# Patient Record
Sex: Male | Born: 1940 | Race: Black or African American | Hispanic: No | Marital: Married | Smoking: Current every day smoker
Health system: Southern US, Community
[De-identification: ages and names within clinical notes are randomized; demographics above are authoritative.]

## PROBLEM LIST (undated history)

## (undated) DIAGNOSIS — J449 Chronic obstructive pulmonary disease, unspecified: Secondary | ICD-10-CM

## (undated) DIAGNOSIS — K259 Gastric ulcer, unspecified as acute or chronic, without hemorrhage or perforation: Secondary | ICD-10-CM

## (undated) HISTORY — PX: OTHER SURGICAL HISTORY: SHX169

## (undated) HISTORY — PX: CHOLECYSTECTOMY: SHX55

---

## 2014-01-04 ENCOUNTER — Emergency Department (HOSPITAL_BASED_OUTPATIENT_CLINIC_OR_DEPARTMENT_OTHER): Payer: Medicare Other

## 2014-01-04 ENCOUNTER — Encounter (HOSPITAL_BASED_OUTPATIENT_CLINIC_OR_DEPARTMENT_OTHER): Payer: Self-pay | Admitting: Emergency Medicine

## 2014-01-04 ENCOUNTER — Emergency Department (HOSPITAL_BASED_OUTPATIENT_CLINIC_OR_DEPARTMENT_OTHER)
Admission: EM | Admit: 2014-01-04 | Discharge: 2014-01-04 | Disposition: A | Discharge status: Other Acute Inpatient Hospital | Payer: Medicare Other | Attending: Emergency Medicine | Admitting: Emergency Medicine

## 2014-01-04 DIAGNOSIS — J159 Unspecified bacterial pneumonia: Secondary | ICD-10-CM | POA: Insufficient documentation

## 2014-01-04 DIAGNOSIS — J438 Other emphysema: Secondary | ICD-10-CM | POA: Insufficient documentation

## 2014-01-04 DIAGNOSIS — J189 Pneumonia, unspecified organism: Secondary | ICD-10-CM

## 2014-01-04 DIAGNOSIS — Z8719 Personal history of other diseases of the digestive system: Secondary | ICD-10-CM | POA: Diagnosis not present

## 2014-01-04 DIAGNOSIS — F17200 Nicotine dependence, unspecified, uncomplicated: Secondary | ICD-10-CM | POA: Diagnosis not present

## 2014-01-04 DIAGNOSIS — R112 Nausea with vomiting, unspecified: Secondary | ICD-10-CM | POA: Diagnosis not present

## 2014-01-04 DIAGNOSIS — R109 Unspecified abdominal pain: Secondary | ICD-10-CM | POA: Insufficient documentation

## 2014-01-04 DIAGNOSIS — R1310 Dysphagia, unspecified: Secondary | ICD-10-CM | POA: Diagnosis present

## 2014-01-04 HISTORY — DX: Gastric ulcer, unspecified as acute or chronic, without hemorrhage or perforation: K25.9

## 2014-01-04 HISTORY — DX: Chronic obstructive pulmonary disease, unspecified: J44.9

## 2014-01-04 LAB — CBC WITH DIFFERENTIAL/PLATELET
Basophils Absolute: 0 10*3/uL (ref 0.0–0.1)
Basophils Relative: 0 % (ref 0–1)
Eosinophils Absolute: 0 10*3/uL (ref 0.0–0.7)
Eosinophils Relative: 0 % (ref 0–5)
HEMATOCRIT: 40 % (ref 39.0–52.0)
Hemoglobin: 14.5 g/dL (ref 13.0–17.0)
LYMPHS ABS: 0.9 10*3/uL (ref 0.7–4.0)
LYMPHS PCT: 5 % — AB (ref 12–46)
MCH: 33.9 pg (ref 26.0–34.0)
MCHC: 36.3 g/dL — AB (ref 30.0–36.0)
MCV: 93.5 fL (ref 78.0–100.0)
MONO ABS: 1.2 10*3/uL — AB (ref 0.1–1.0)
MONOS PCT: 6 % (ref 3–12)
NEUTROS ABS: 15.8 10*3/uL — AB (ref 1.7–7.7)
Neutrophils Relative %: 88 % — ABNORMAL HIGH (ref 43–77)
Platelets: 236 10*3/uL (ref 150–400)
RBC: 4.28 MIL/uL (ref 4.22–5.81)
RDW: 12.6 % (ref 11.5–15.5)
WBC: 17.9 10*3/uL — AB (ref 4.0–10.5)

## 2014-01-04 LAB — COMPREHENSIVE METABOLIC PANEL
ALK PHOS: 71 U/L (ref 39–117)
ALT: 8 U/L (ref 0–53)
AST: 14 U/L (ref 0–37)
Albumin: 3.2 g/dL — ABNORMAL LOW (ref 3.5–5.2)
Anion gap: 18 — ABNORMAL HIGH (ref 5–15)
BILIRUBIN TOTAL: 1.1 mg/dL (ref 0.3–1.2)
BUN: 14 mg/dL (ref 6–23)
CHLORIDE: 100 meq/L (ref 96–112)
CO2: 22 meq/L (ref 19–32)
Calcium: 9.7 mg/dL (ref 8.4–10.5)
Creatinine, Ser: 0.6 mg/dL (ref 0.50–1.35)
GFR calc Af Amer: >90 mL/min (ref >90)
GLUCOSE: 129 mg/dL — AB (ref 70–99)
POTASSIUM: 3.9 meq/L (ref 3.7–5.3)
Sodium: 140 mEq/L (ref 137–147)
Total Protein: 7.2 g/dL (ref 6.0–8.3)

## 2014-01-04 LAB — LIPASE, BLOOD: LIPASE: 14 U/L (ref 11–59)

## 2014-01-04 MED ORDER — DEXTROSE 5 % IV SOLN: 1.0000 g | INTRAVENOUS | Status: DC

## 2014-01-04 MED ORDER — DEXTROSE 5 % IV SOLN
500.0000 mg | INTRAVENOUS | Status: DC
  Administered 2014-01-04: 500 mg via INTRAVENOUS
  Filled 2014-01-04: qty 500

## 2014-01-04 MED ORDER — SODIUM CHLORIDE 0.9 % IV BOLUS (SEPSIS): 1000.0000 mL | Freq: Once | INTRAVENOUS | Status: DC

## 2014-01-04 MED ORDER — ONDANSETRON HCL 4 MG/2ML IJ SOLN
4.0000 mg | Freq: Once | INTRAMUSCULAR | Status: AC
  Administered 2014-01-04: 4 mg via INTRAVENOUS
  Filled 2014-01-04: qty 2

## 2014-01-04 MED ORDER — SODIUM CHLORIDE 0.9 % IV BOLUS (SEPSIS)
500.0000 mL | Freq: Once | INTRAVENOUS | Status: AC
  Administered 2014-01-04: 500 mL via INTRAVENOUS

## 2014-01-04 MED ORDER — IOHEXOL 300 MG/ML  SOLN
100.0000 mL | Freq: Once | INTRAMUSCULAR | Status: AC | PRN
  Administered 2014-01-04: 100 mL via INTRAVENOUS

## 2014-01-04 MED ORDER — SODIUM CHLORIDE 0.9 % IV SOLN
INTRAVENOUS | Status: DC
Start: 2014-01-04 — End: 2014-01-04
  Administered 2014-01-04: 10:00:00 via INTRAVENOUS

## 2014-01-04 NOTE — ED Notes (Addendum)
Difficulty swallowing x 3 days and unable to keep po fluids down. Vomiting several times.  Also reports chronic back pain and intermittent abdominal pain.  Went to [facility] last night, had labs collected but left prior to being evaluated by a physician.

## 2014-01-04 NOTE — ED Provider Notes (Signed)
CSN: [id number]     Arrival date & time 01/04/14  [id number] History   First MD Initiated Contact with Patient 01/04/14 [phone]     Chief Complaint  Patient presents with  . Dysphagia     (Consider location/radiation/quality/duration/timing/severity/associated sxs/prior Treatment) The history is provided by the patient.   Patient here complaining of two-day history of nonbilious vomiting with associated nausea without fever. He has no weight loss over the past month. He has had some cough and shortness of breath. Denies any urinary symptoms. No black or bloody stools. No chest pain or chest pressure. He does have a history of COPD but denies any increased symptoms with that. Went to another hospital yesterday but did not get seen. Symptoms are persistent and nothing makes them better or worse. No treatment used prior to arrival Past Medical History  Diagnosis Date  . COPD (chronic obstructive pulmonary disease)   . Multiple gastric ulcers    Past Surgical History  Procedure Laterality Date  . Cholecystectomy    . Gastic ulcer surgery     No family history on file. History  Substance Use Topics  . Smoking status: Current Every Day Smoker -- 1.00 packs/day    Types: Cigarettes  . Smokeless tobacco: Not on file  . Alcohol Use: No    Review of Systems  All other systems reviewed and are negative.     Allergies  Review of patient's allergies indicates no known allergies.  Home Medications   Prior to Admission medications   Medication Sig Start Date End Date Taking? Authorizing Provider  acetaminophen (TYLENOL) 325 MG tablet Take 650 mg by mouth every 6 (six) hours as needed.   Yes Historical Provider, MD  Aspirin-Caffeine 845-65 MG PACK Take by mouth.   Yes Historical Provider, MD   BP 126/75  Pulse 97  Temp(Src) 98.1 F (36.7 C) (Oral)  Resp 18  Ht 5\' 6"  (1.676 m)  Wt 130 lb (58.968 kg)  BMI 20.99 kg/m2  SpO2 94% Physical Exam  Nursing note and vitals  reviewed. Constitutional: He is oriented to person, place, and time. He appears cachectic.  Non-toxic appearance. No distress.  HENT:  Head: Normocephalic and atraumatic.  Eyes: Conjunctivae, EOM and lids are normal. Pupils are equal, round, and reactive to light.  Neck: Normal range of motion. Neck supple. No tracheal deviation present. No mass present.  Cardiovascular: Normal rate, regular rhythm and normal heart sounds.  Exam reveals no gallop.   No murmur heard. Pulmonary/Chest: Effort normal and breath sounds normal. No stridor. No respiratory distress. He has no decreased breath sounds. He has no wheezes. He has no rhonchi. He has no rales.  Abdominal: Soft. Normal appearance and bowel sounds are normal. He exhibits no distension. There is no tenderness. There is no rebound and no CVA tenderness.  Musculoskeletal: Normal range of motion. He exhibits no edema and no tenderness.  Neurological: He is alert and oriented to person, place, and time. He has normal strength. No cranial nerve deficit or sensory deficit. GCS eye subscore is 4. GCS verbal subscore is 5. GCS motor subscore is 6.  Skin: Skin is warm and dry. No abrasion and no rash noted.  Psychiatric: He has a normal mood and affect. His speech is normal and behavior is normal.    ED Course  Procedures (including critical care time) Labs Review Labs Reviewed  URINALYSIS, ROUTINE W REFLEX MICROSCOPIC  CBC WITH DIFFERENTIAL  COMPREHENSIVE METABOLIC PANEL  LIPASE, BLOOD    Imaging  Review No results found.   EKG Interpretation None      MDM   Final diagnoses:  None    Patient's CT scan consistent with pneumonia.Patient started on antibiotics and will be transferred to [facility] per his request   Toy Baker, MD 01/04/14 1138

## 2014-06-25 DEATH — deceased

## 2015-08-06 IMAGING — CT CT CHEST W/ CM
3 of 5 series · 6 of 46 positions shown, 11 images · IV contrast (APPLIED)
Comparison: 10/13/2012 CT

CLINICAL DATA: 73-year-old male with dysphagia, vomiting, abdominal
and pelvic pain.

EXAM:
CT CHEST, ABDOMEN, AND PELVIS WITH CONTRAST
TECHNIQUE: Multidetector CT imaging of the chest, abdomen and pelvis was
performed following the standard protocol during bolus
administration of intravenous contrast.
CONTRAST:  100mL OMNIPAQUE IOHEXOL 300 MG/ML  SOLN

[Series 5: chest/abd/pel 3.0 coronal · coronal · 0.62mm/px · 3 of 73 slices shown, 4 images]
[im 25/73  soft-tissue]
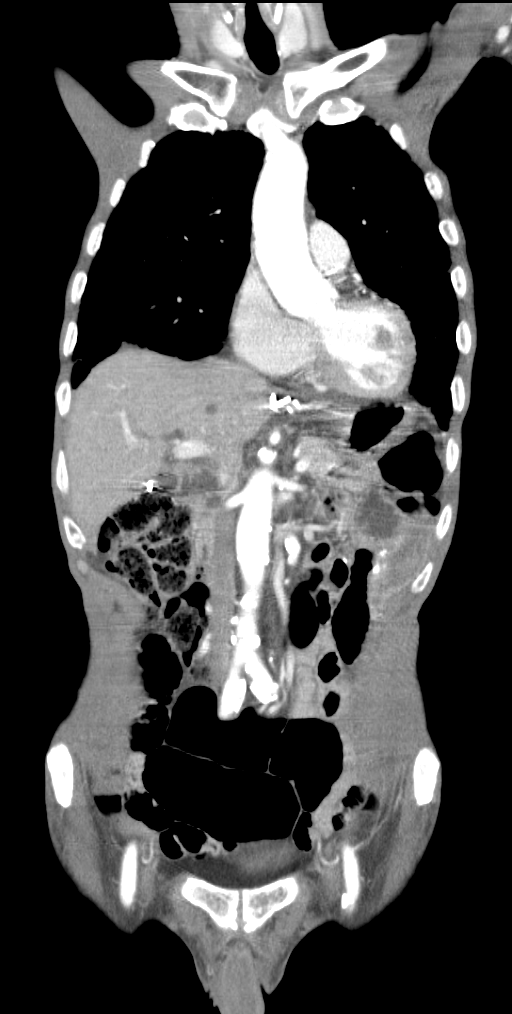
[im 33/73  soft-tissue]
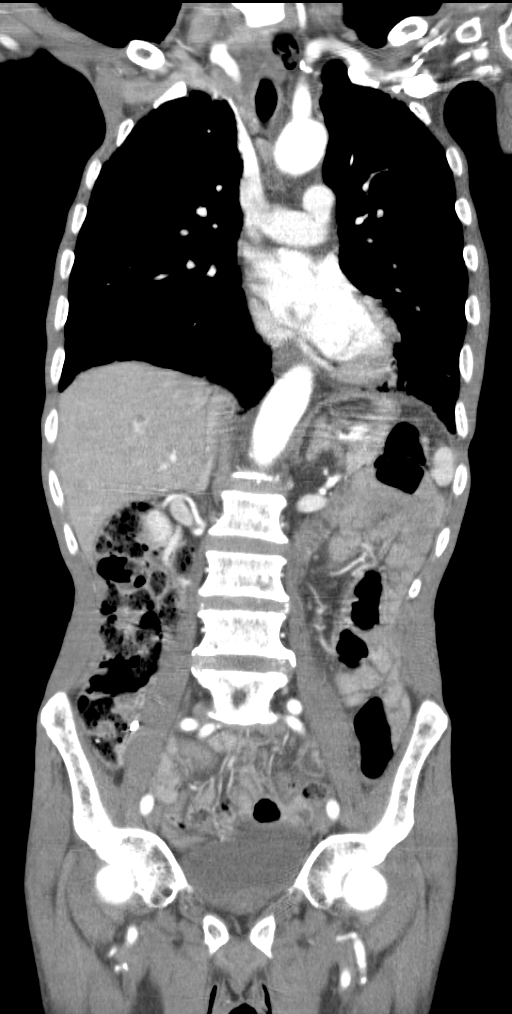
[im 33/73  bone]
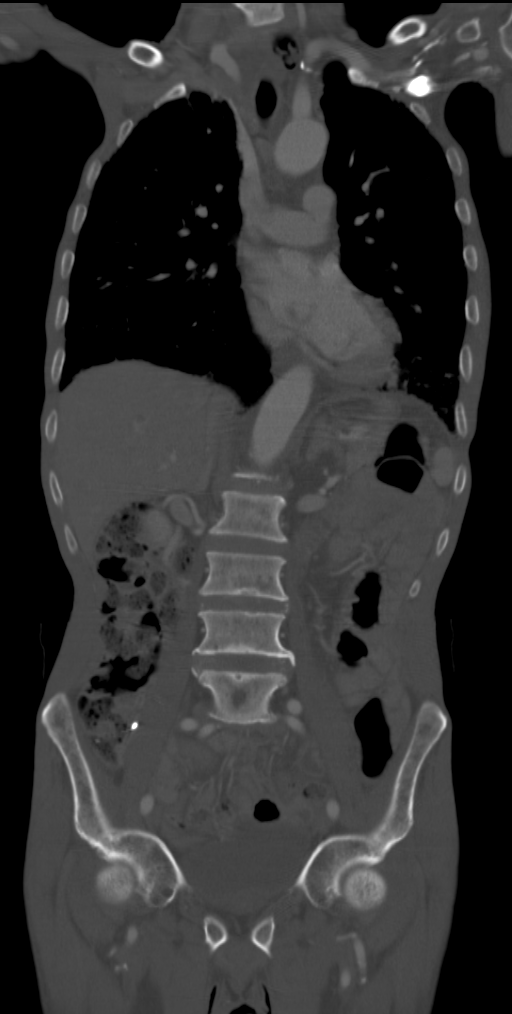
[im 41/73  soft-tissue]
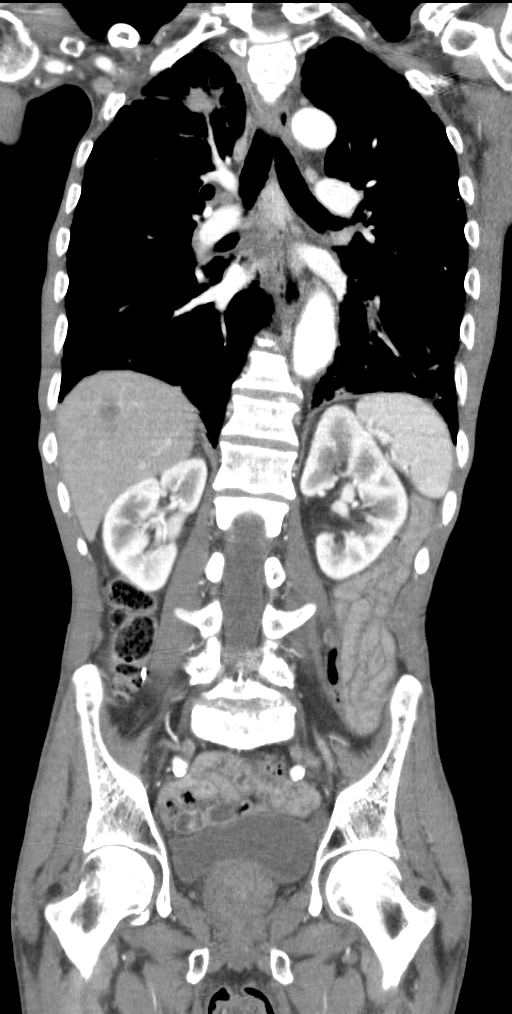

[Series 6: chest/abd/pel 3.0 sagittal · sagittal · 0.50mm/px · 1 of 106 slices shown, 2 images]
[im 36/106  soft-tissue]
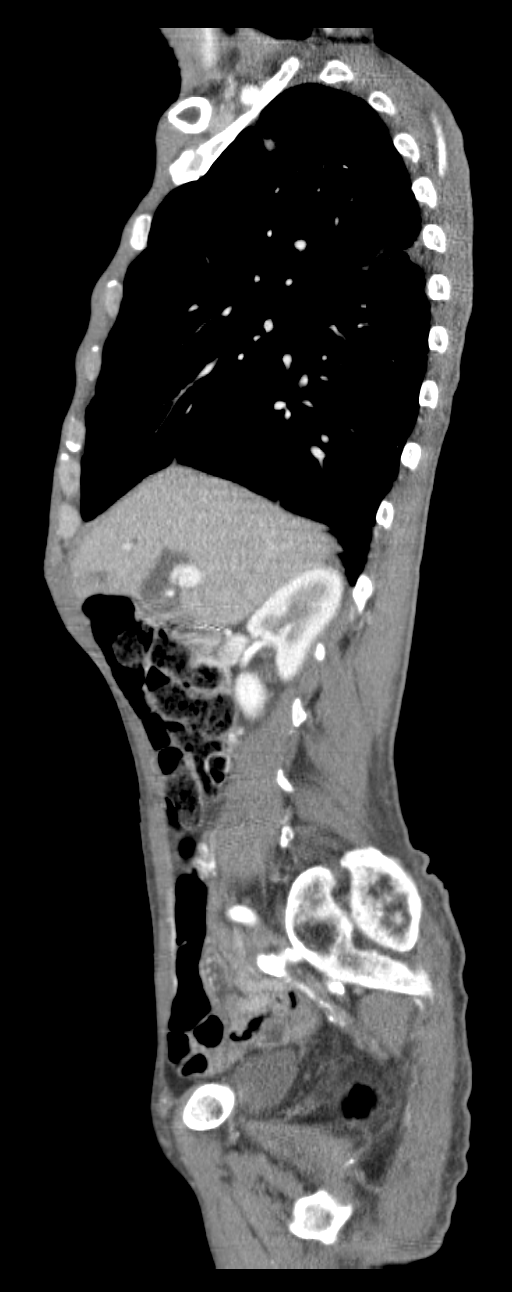
[im 36/106  bone]
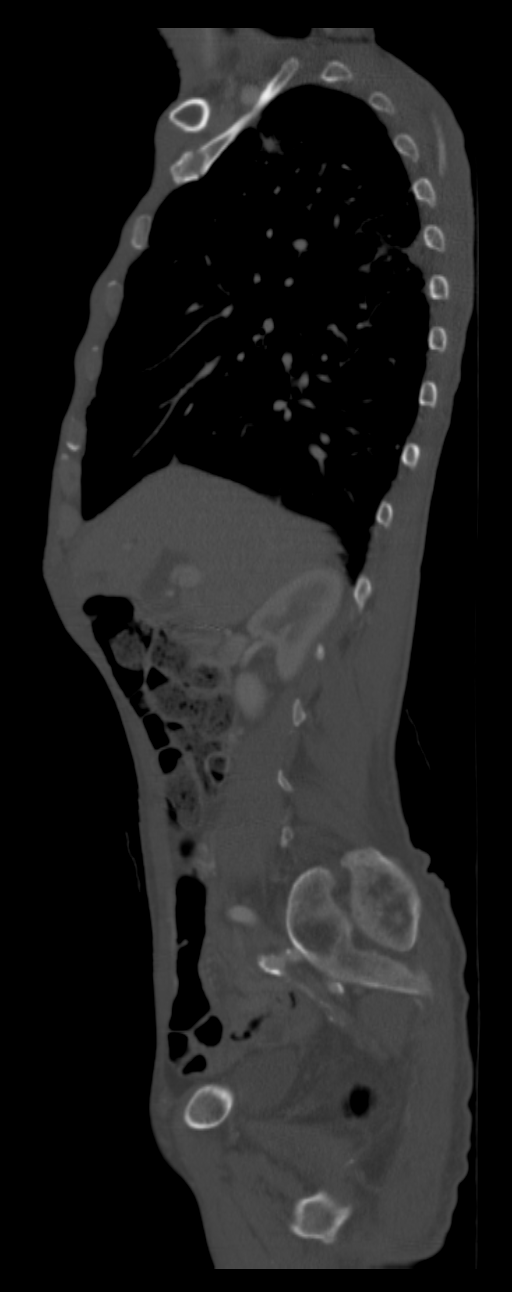

[Series 7: renal delay 5.0 b30f · axial · delayed · 0.57mm/px · z∈[-345,-300]mm · 2 of 29 slices shown, 5 images]
[im 10/29  soft-tissue]
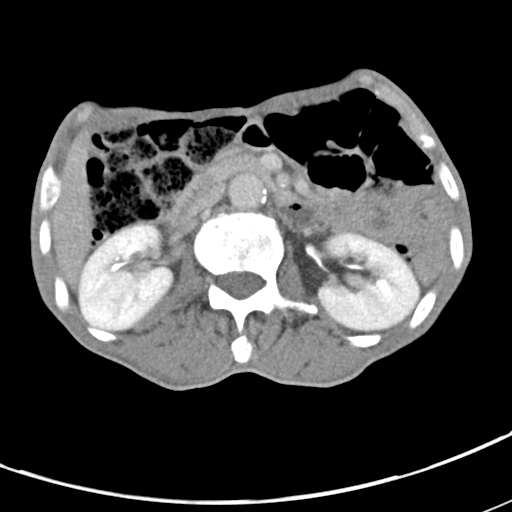
[im 10/29  lung]
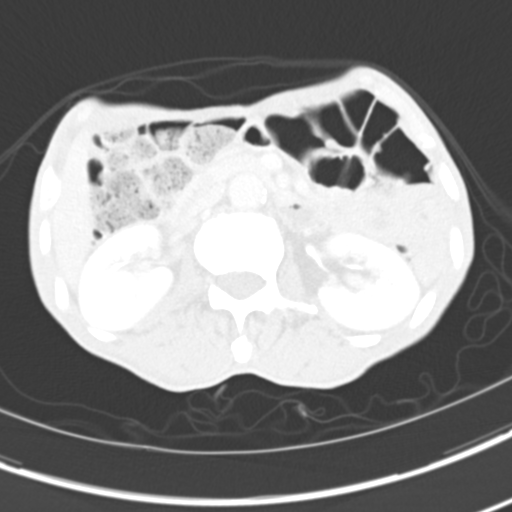
[im 10/29  bone]
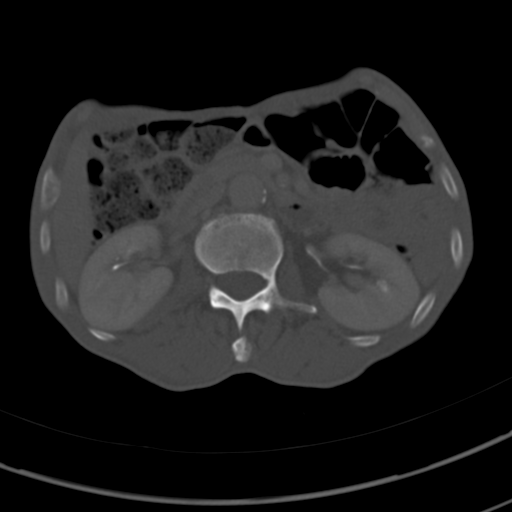
[im 19/29  soft-tissue]
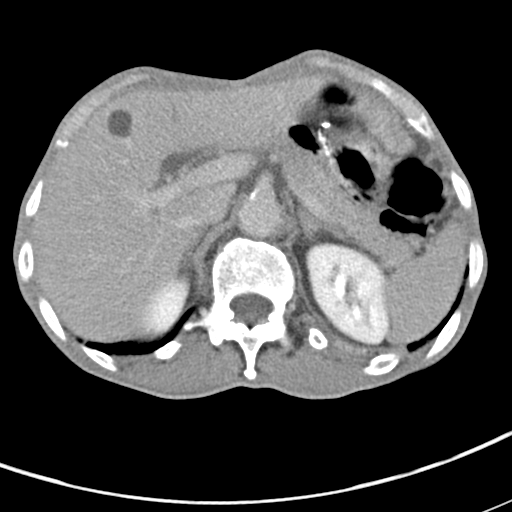
[im 19/29  lung]
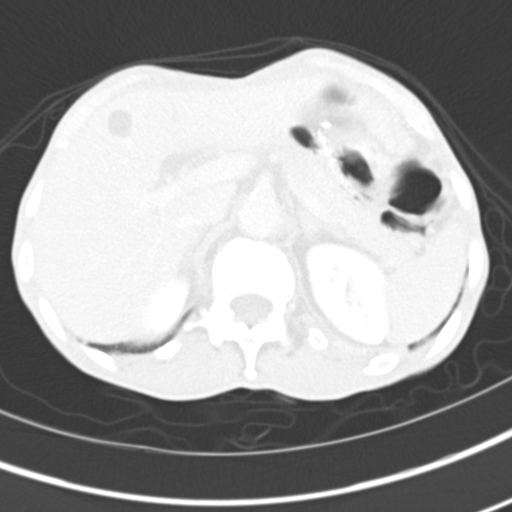

[6 of 46 positions shown; findings below may reference images not displayed]

FINDINGS: CT CHEST FINDINGS

Moderate coronary artery calcifications are again noted.

There is no evidence of thoracic aortic aneurysm. There are no
pleural or pericardial effusions.

Mediastinal lymph nodes have slightly enlarged including a 1 cm
right peritracheal lymph node (image 15), previously measuring
cm, as well as a 1.1 x 2 cm sub carinal node (image 26), previously
0.6 x 1.3 cm.

New ill-defined soft tissue within the high right mediastinum
posterior and medial to the right subclavian artery is suspicious
for adenopathy/metastatic disease, which has mass effect on the
trachea and lower cervical esophagus.

A 2.2 x 3.5 cm of spiculated mass in the medial right lung apex has
increased in size, previously 2 x 2.4 cm, and compatible with
neoplasm.

New airspace disease and consolidation within the left lower lobe is
compatible with pneumonia or aspiration.

COPD/emphysema changes are again noted.

No other changes are identified.

No acute or suspicious bony abnormalities are identified.

CT ABDOMEN AND PELVIS FINDINGS

Evidence of gastroenteric surgery again noted as well as surgical
clips in the right lower abdomen.

Hepatic cysts are unchanged.

Mild intrahepatic and CBD dilatation to the ampulla is unchanged
with the CBD measuring up to 13 mm in greatest diameter.

The spleen, adrenal glands, pancreas and kidneys are unremarkable
except for unchanged mild bilateral renal cortical thinning.

This patient is status post cholecystectomy.

There is no evidence of free fluid, abdominal aortic aneurysm or
enlarged lymph nodes.

The bowel and bladder are otherwise unremarkable. There is no
evidence of bowel obstruction, pneumoperitoneum or abscess.

Mild prostate enlargement is noted.

Scattered lucencies within the visualized lumbar spine and bony
pelvis are unchanged.
IMPRESSION: New left lower lobe consolidation/airspace disease compatible with
pneumonia or aspiration.

Enlarging 2.2 x 3.5 cm right apical lung mass compatible with
neoplasm. New ill-defined soft tissue within the high right
mediastinum is suspicious for lymphadenopathy/metastatic disease
which has mass effect on the trachea and lower cervical esophagus
and may account the patient's dysphagia.

Slightly enlarging mediastinal lymph nodes suspicious for metastatic
disease.

No evidence of acute abnormality within the abdomen or pelvis.
Unchanged mild intrahepatic and CBD dilatation.

Coronary artery disease and COPD/emphysema.
